# Patient Record
Sex: Female | Born: 1975 | Race: White | Hispanic: No | Marital: Single | Smoking: Former smoker
Health system: Southern US, Community
[De-identification: ages and names within clinical notes are randomized; demographics above are authoritative.]

## PROBLEM LIST (undated history)

## (undated) DIAGNOSIS — M5134 Other intervertebral disc degeneration, thoracic region: Secondary | ICD-10-CM

## (undated) DIAGNOSIS — N289 Disorder of kidney and ureter, unspecified: Secondary | ICD-10-CM

## (undated) DIAGNOSIS — K5090 Crohn's disease, unspecified, without complications: Secondary | ICD-10-CM

## (undated) DIAGNOSIS — K589 Irritable bowel syndrome without diarrhea: Secondary | ICD-10-CM

## (undated) DIAGNOSIS — M1990 Unspecified osteoarthritis, unspecified site: Secondary | ICD-10-CM

## (undated) DIAGNOSIS — M797 Fibromyalgia: Secondary | ICD-10-CM

## (undated) HISTORY — PX: RENAL ARTERY STENT: SHX2321

## (undated) HISTORY — PX: TONSILLECTOMY: SUR1361

## (undated) HISTORY — PX: ABDOMINAL HYSTERECTOMY: SHX81

---

## 2016-05-03 HISTORY — PX: FRACTURE SURGERY: SHX138

## 2019-12-15 ENCOUNTER — Other Ambulatory Visit: Payer: Self-pay | Admitting: Radiology

## 2019-12-15 ENCOUNTER — Other Ambulatory Visit: Payer: Self-pay

## 2019-12-15 ENCOUNTER — Encounter: Payer: Self-pay | Admitting: Emergency Medicine

## 2019-12-15 ENCOUNTER — Ambulatory Visit: Payer: Medicaid - Out of State

## 2019-12-15 ENCOUNTER — Emergency Department: Payer: Medicaid - Out of State

## 2019-12-15 ENCOUNTER — Ambulatory Visit: Admission: RE | Admit: 2019-12-15 | Payer: Medicaid - Out of State | Source: Ambulatory Visit

## 2019-12-15 ENCOUNTER — Emergency Department
Admission: EM | Admit: 2019-12-15 | Discharge: 2019-12-15 | Disposition: A | Discharge status: Home / Self Care | Payer: Medicaid - Out of State | Attending: Emergency Medicine | Admitting: Emergency Medicine

## 2019-12-15 DIAGNOSIS — Y999 Unspecified external cause status: Secondary | ICD-10-CM | POA: Insufficient documentation

## 2019-12-15 DIAGNOSIS — W01198A Fall on same level from slipping, tripping and stumbling with subsequent striking against other object, initial encounter: Secondary | ICD-10-CM | POA: Diagnosis not present

## 2019-12-15 DIAGNOSIS — S069X9A Unspecified intracranial injury with loss of consciousness of unspecified duration, initial encounter: Secondary | ICD-10-CM | POA: Diagnosis present

## 2019-12-15 DIAGNOSIS — Z5321 Procedure and treatment not carried out due to patient leaving prior to being seen by health care provider: Secondary | ICD-10-CM | POA: Diagnosis not present

## 2019-12-15 DIAGNOSIS — Y92002 Bathroom of unspecified non-institutional (private) residence single-family (private) house as the place of occurrence of the external cause: Secondary | ICD-10-CM | POA: Diagnosis not present

## 2019-12-15 DIAGNOSIS — Y939 Activity, unspecified: Secondary | ICD-10-CM | POA: Diagnosis not present

## 2019-12-15 HISTORY — DX: Other intervertebral disc degeneration, thoracic region: M51.34

## 2019-12-15 HISTORY — DX: Unspecified osteoarthritis, unspecified site: M19.90

## 2019-12-15 HISTORY — DX: Fibromyalgia: M79.7

## 2019-12-15 HISTORY — DX: Crohn's disease, unspecified, without complications: K50.90

## 2019-12-15 HISTORY — DX: Irritable bowel syndrome without diarrhea: K58.9

## 2019-12-15 LAB — CBC
HCT: 42.7 % (ref 36.0–46.0)
Hemoglobin: 14.6 g/dL (ref 12.0–15.0)
MCH: 32.2 pg (ref 26.0–34.0)
MCHC: 34.2 g/dL (ref 30.0–36.0)
MCV: 94.1 fL (ref 80.0–100.0)
Platelets: 524 10*3/uL — ABNORMAL HIGH (ref 150–400)
RBC: 4.54 MIL/uL (ref 3.87–5.11)
RDW: 12.2 % (ref 11.5–15.5)
WBC: 15.4 10*3/uL — ABNORMAL HIGH (ref 4.0–10.5)
nRBC: 0 % (ref 0.0–0.2)

## 2019-12-15 LAB — BASIC METABOLIC PANEL
Anion gap: 9 (ref 5–15)
BUN: 16 mg/dL (ref 6–20)
CO2: 25 mmol/L (ref 22–32)
Calcium: 9 mg/dL (ref 8.9–10.3)
Chloride: 103 mmol/L (ref 98–111)
Creatinine, Ser: 0.74 mg/dL (ref 0.44–1.00)
GFR calc Af Amer: >60 mL/min (ref >60)
GFR calc non Af Amer: >60 mL/min (ref >60)
Glucose, Bld: 109 mg/dL — ABNORMAL HIGH (ref 70–99)
Potassium: 3.9 mmol/L (ref 3.5–5.1)
Sodium: 137 mmol/L (ref 135–145)

## 2019-12-15 NOTE — ED Notes (Signed)
Pt called for repeat VS, no answer x 3.

## 2019-12-15 NOTE — ED Triage Notes (Signed)
Pt to ED via POV stating that she fell today in the bathroom and hit her head on the wall. Pt reports positive LOC, unsure how long she was out. Pt denies use of blood thinners. Pt is in NAD.

## 2019-12-15 NOTE — ED Notes (Signed)
Pt called for repeat VS, no answer x 2.

## 2019-12-15 NOTE — ED Notes (Signed)
Pt called for CT x 1.

## 2019-12-17 ENCOUNTER — Emergency Department
Admission: EM | Admit: 2019-12-17 | Discharge: 2019-12-17 | Discharge status: Against Medical Advice | Payer: Medicaid - Out of State

## 2019-12-17 ENCOUNTER — Other Ambulatory Visit: Payer: Self-pay

## 2019-12-17 ENCOUNTER — Emergency Department
Admission: EM | Admit: 2019-12-17 | Discharge: 2019-12-18 | Disposition: A | Discharge status: Other Acute Inpatient Hospital | Payer: Medicaid - Out of State | Attending: Emergency Medicine | Admitting: Emergency Medicine

## 2019-12-17 DIAGNOSIS — Z20822 Contact with and (suspected) exposure to covid-19: Secondary | ICD-10-CM | POA: Diagnosis not present

## 2019-12-17 DIAGNOSIS — M797 Fibromyalgia: Secondary | ICD-10-CM | POA: Diagnosis not present

## 2019-12-17 DIAGNOSIS — F17210 Nicotine dependence, cigarettes, uncomplicated: Secondary | ICD-10-CM | POA: Diagnosis not present

## 2019-12-17 DIAGNOSIS — F329 Major depressive disorder, single episode, unspecified: Secondary | ICD-10-CM | POA: Diagnosis not present

## 2019-12-17 DIAGNOSIS — T424X4A Poisoning by benzodiazepines, undetermined, initial encounter: Secondary | ICD-10-CM | POA: Diagnosis present

## 2019-12-17 DIAGNOSIS — T50902A Poisoning by unspecified drugs, medicaments and biological substances, intentional self-harm, initial encounter: Secondary | ICD-10-CM

## 2019-12-17 DIAGNOSIS — R519 Headache, unspecified: Secondary | ICD-10-CM | POA: Insufficient documentation

## 2019-12-17 LAB — CBC
HCT: 39.4 % (ref 36.0–46.0)
Hemoglobin: 14 g/dL (ref 12.0–15.0)
MCH: 32.6 pg (ref 26.0–34.0)
MCHC: 35.5 g/dL (ref 30.0–36.0)
MCV: 91.6 fL (ref 80.0–100.0)
Platelets: 453 10*3/uL — ABNORMAL HIGH (ref 150–400)
RBC: 4.3 MIL/uL (ref 3.87–5.11)
RDW: 12.2 % (ref 11.5–15.5)
WBC: 9.6 10*3/uL (ref 4.0–10.5)
nRBC: 0 % (ref 0.0–0.2)

## 2019-12-17 LAB — COMPREHENSIVE METABOLIC PANEL
ALT: 23 U/L (ref 0–44)
AST: 18 U/L (ref 15–41)
Albumin: 3.9 g/dL (ref 3.5–5.0)
Alkaline Phosphatase: 78 U/L (ref 38–126)
Anion gap: 12 (ref 5–15)
BUN: 12 mg/dL (ref 6–20)
CO2: 26 mmol/L (ref 22–32)
Calcium: 9.1 mg/dL (ref 8.9–10.3)
Chloride: 102 mmol/L (ref 98–111)
Creatinine, Ser: 0.78 mg/dL (ref 0.44–1.00)
GFR calc Af Amer: >60 mL/min (ref >60)
GFR calc non Af Amer: >60 mL/min (ref >60)
Glucose, Bld: 95 mg/dL (ref 70–99)
Potassium: 3.3 mmol/L — ABNORMAL LOW (ref 3.5–5.1)
Sodium: 140 mmol/L (ref 135–145)
Total Bilirubin: 1.2 mg/dL (ref 0.3–1.2)
Total Protein: 7.3 g/dL (ref 6.5–8.1)

## 2019-12-17 LAB — SALICYLATE LEVEL: Salicylate Lvl: <7 mg/dL — ABNORMAL LOW (ref 7.0–30.0)

## 2019-12-17 LAB — ACETAMINOPHEN LEVEL: Acetaminophen (Tylenol), Serum: <10 ug/mL — ABNORMAL LOW (ref 10–30)

## 2019-12-17 LAB — ETHANOL: Alcohol, Ethyl (B): <10 mg/dL (ref <10)

## 2019-12-17 MED ORDER — IBUPROFEN 600 MG PO TABS
600.0000 mg | ORAL_TABLET | Freq: Once | ORAL | Status: AC
  Administered 2019-12-17: 600 mg via ORAL
  Filled 2019-12-17: qty 1

## 2019-12-17 MED ORDER — ONDANSETRON HCL 4 MG/2ML IJ SOLN
INTRAMUSCULAR | Status: AC
  Administered 2019-12-17: 4 mg via INTRAVENOUS
  Filled 2019-12-17: qty 2

## 2019-12-17 MED ORDER — ONDANSETRON HCL 4 MG/2ML IJ SOLN: 4.0000 mg | Freq: Once | INTRAMUSCULAR | Status: AC

## 2019-12-17 NOTE — ED Notes (Signed)
Attempted to collect urine specimen, pt missed cup. Will try again with hat in toilet

## 2019-12-17 NOTE — ED Notes (Addendum)
Pt dressed out into burgundy scrubs from off white shirt, black pants, flip flops, grey underwear with Vernona Rieger, NT. Pt placed back onto heart monitor.
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
## 2019-12-17 NOTE — ED Notes (Signed)
Patient in room. No complaints, stable, in no acute distress. Q15 minute rounds and monitoring via Rover and Officer to continue.  
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
## 2019-12-17 NOTE — ED Notes (Signed)
Called   No answer in lobby  

## 2019-12-17 NOTE — ED Notes (Signed)
Called for room and triage   No answer

## 2019-12-17 NOTE — ED Notes (Addendum)
Kim Watkins (daughter) [phone]  Pt gives verbal consent to update daughter on care and status

## 2019-12-17 NOTE — ED Notes (Addendum)
Pt reports currently being homeless, visiting here from [place] and staying with family.   Pt admits to taking 10mg  hydrocodone-tylenol some time this AM, prescribed for chronic back pain

## 2019-12-17 NOTE — ED Notes (Signed)
Patient in room. No complaints, stable, in no acute distress. Q15 minute rounds and monitoring via Psychologist, counselling to continue.

## 2019-12-17 NOTE — ED Notes (Signed)
Called No answer.

## 2019-12-17 NOTE — ED Notes (Signed)
Pt dressed out into ER psych scrubs by Tyberius Ryner and morgan tech  Belongings are in pt room at this time.  lw edt
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
## 2019-12-18 ENCOUNTER — Encounter: Payer: Self-pay | Admitting: Psychiatry

## 2019-12-18 ENCOUNTER — Inpatient Hospital Stay: Payer: Medicaid - Out of State

## 2019-12-18 ENCOUNTER — Inpatient Hospital Stay
Admission: AD | Admit: 2019-12-18 | Discharge: 2019-12-19 | DRG: 882 | Disposition: A | Discharge status: Home / Self Care | Payer: Medicaid - Out of State | Source: Intra-hospital | Attending: Psychiatry | Admitting: Psychiatry

## 2019-12-18 DIAGNOSIS — Z20822 Contact with and (suspected) exposure to covid-19: Secondary | ICD-10-CM | POA: Diagnosis present

## 2019-12-18 DIAGNOSIS — S0990XA Unspecified injury of head, initial encounter: Secondary | ICD-10-CM

## 2019-12-18 DIAGNOSIS — M797 Fibromyalgia: Secondary | ICD-10-CM | POA: Diagnosis present

## 2019-12-18 DIAGNOSIS — F411 Generalized anxiety disorder: Secondary | ICD-10-CM | POA: Diagnosis present

## 2019-12-18 DIAGNOSIS — F17210 Nicotine dependence, cigarettes, uncomplicated: Secondary | ICD-10-CM | POA: Diagnosis present

## 2019-12-18 DIAGNOSIS — K219 Gastro-esophageal reflux disease without esophagitis: Secondary | ICD-10-CM | POA: Diagnosis present

## 2019-12-18 DIAGNOSIS — Z79899 Other long term (current) drug therapy: Secondary | ICD-10-CM | POA: Diagnosis not present

## 2019-12-18 DIAGNOSIS — T424X2A Poisoning by benzodiazepines, intentional self-harm, initial encounter: Secondary | ICD-10-CM | POA: Diagnosis present

## 2019-12-18 DIAGNOSIS — F32A Depression, unspecified: Secondary | ICD-10-CM | POA: Diagnosis present

## 2019-12-18 DIAGNOSIS — J45909 Unspecified asthma, uncomplicated: Secondary | ICD-10-CM | POA: Diagnosis present

## 2019-12-18 DIAGNOSIS — F329 Major depressive disorder, single episode, unspecified: Secondary | ICD-10-CM | POA: Diagnosis present

## 2019-12-18 DIAGNOSIS — T424X4A Poisoning by benzodiazepines, undetermined, initial encounter: Secondary | ICD-10-CM | POA: Diagnosis not present

## 2019-12-18 DIAGNOSIS — G894 Chronic pain syndrome: Secondary | ICD-10-CM | POA: Diagnosis present

## 2019-12-18 DIAGNOSIS — F4323 Adjustment disorder with mixed anxiety and depressed mood: Secondary | ICD-10-CM | POA: Diagnosis present

## 2019-12-18 LAB — URINE DRUG SCREEN, QUALITATIVE (ARMC ONLY)
Amphetamines, Ur Screen: NOT DETECTED
Barbiturates, Ur Screen: NOT DETECTED
Benzodiazepine, Ur Scrn: POSITIVE — AB
Cannabinoid 50 Ng, Ur ~~LOC~~: NOT DETECTED
Cocaine Metabolite,Ur ~~LOC~~: NOT DETECTED
MDMA (Ecstasy)Ur Screen: NOT DETECTED
Methadone Scn, Ur: NOT DETECTED
Opiate, Ur Screen: NOT DETECTED
Phencyclidine (PCP) Ur S: NOT DETECTED
Tricyclic, Ur Screen: NOT DETECTED

## 2019-12-18 LAB — SARS CORONAVIRUS 2 BY RT PCR (HOSPITAL ORDER, PERFORMED IN ~~LOC~~ HOSPITAL LAB): SARS Coronavirus 2: NEGATIVE

## 2019-12-18 LAB — POCT PREGNANCY, URINE: Preg Test, Ur: NEGATIVE

## 2019-12-18 MED ORDER — FLUOXETINE HCL 20 MG PO CAPS
60.0000 mg | ORAL_CAPSULE | Freq: Every day | ORAL | Status: DC
  Administered 2019-12-18 – 2019-12-19 (×2): 60 mg via ORAL
  Filled 2019-12-18 (×2): qty 3

## 2019-12-18 MED ORDER — LORAZEPAM 1 MG PO TABS
0.5000 mg | ORAL_TABLET | Freq: Three times a day (TID) | ORAL | Status: DC
  Administered 2019-12-18 – 2019-12-19 (×3): 0.5 mg via ORAL
  Filled 2019-12-18 (×3): qty 1

## 2019-12-18 MED ORDER — MELOXICAM 7.5 MG PO TABS: 15.0000 mg | ORAL_TABLET | Freq: Every day | ORAL | Status: DC

## 2019-12-18 MED ORDER — NAPROXEN 500 MG PO TABS
500.0000 mg | ORAL_TABLET | Freq: Two times a day (BID) | ORAL | Status: DC | PRN
  Administered 2019-12-18: 500 mg via ORAL
  Filled 2019-12-18: qty 1

## 2019-12-18 MED ORDER — ALBUTEROL SULFATE HFA 108 (90 BASE) MCG/ACT IN AERS
2.0000 | INHALATION_SPRAY | Freq: Four times a day (QID) | RESPIRATORY_TRACT | Status: DC | PRN
  Filled 2019-12-18: qty 6.7

## 2019-12-18 MED ORDER — PANTOPRAZOLE SODIUM 40 MG PO TBEC
40.0000 mg | DELAYED_RELEASE_TABLET | Freq: Every day | ORAL | Status: DC
  Administered 2019-12-18 – 2019-12-19 (×2): 40 mg via ORAL
  Filled 2019-12-18 (×2): qty 1

## 2019-12-18 MED ORDER — ACETAMINOPHEN 325 MG PO TABS: 650.0000 mg | ORAL_TABLET | Freq: Four times a day (QID) | ORAL | Status: DC | PRN

## 2019-12-18 MED ORDER — MAGNESIUM HYDROXIDE 400 MG/5ML PO SUSP: 30.0000 mL | Freq: Every day | ORAL | Status: DC | PRN

## 2019-12-18 MED ORDER — ALUM & MAG HYDROXIDE-SIMETH 200-200-20 MG/5ML PO SUSP: 30.0000 mL | ORAL | Status: DC | PRN

## 2019-12-18 NOTE — ED Notes (Signed)
Pt provided with additional blankets per request. Updated on plan of care. Denies further needs at this time

## 2019-12-18 NOTE — ED Notes (Signed)
Patient in room. No complaints, stable, in no acute distress. Q15 minute rounds and monitoring via Rover and Officer to continue.  

## 2019-12-18 NOTE — ED Notes (Signed)
Urine sample contaminated with toilet paper. Will attempt to collect again

## 2019-12-18 NOTE — ED Notes (Signed)
Pt provided with meal tray, plastic spoon, two cans of shasta cola per request. Denies further needs at this time 

## 2019-12-18 NOTE — Tx Team (Signed)
Initial Treatment Plan 12/18/2019 1:24 PM [name] DHR:[id number]    PATIENT STRESSORS: Legal issue Marital or family conflict   PATIENT STRENGTHS: Capable of independent living Motivation for treatment/growth   PATIENT IDENTIFIED PROBLEMS: Lack of support system  Ineffective coping skills                   DISCHARGE CRITERIA:  Improved stabilization in mood, thinking, and/or behavior Motivation to continue treatment in a less acute level of care  PRELIMINARY DISCHARGE PLAN: Outpatient therapy Return to previous living arrangement  PATIENT/FAMILY INVOLVEMENT: This treatment plan has been presented to and reviewed with the patient, Kim Watkins, and/or family member.  The patient and family have been given the opportunity to ask questions and make suggestions.  Sharin Mons, RN 12/18/2019, 1:24 PM

## 2019-12-18 NOTE — BH Assessment (Addendum)
PATIENT BED AVAILABLE 12/18/19 during day shift or 12/18/19 during night shift  Patient is to be admitted to [facility] Attending Physician will be Dr. Toni Amend.   Patient has been assigned to room 323, by [facility] Charge Nurse [place].    ER staff is aware of the admission:  [facility] ER Secretary    Dr. Don Perking, ER MD   Anette Riedel Patient's Nurse   [facility] Patient Access.
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
## 2019-12-18 NOTE — ED Notes (Signed)
Pt called out requesting something to drink - drink and snack provided  She is lying in bed with her eyes closed  - appears to be sleeping

## 2019-12-18 NOTE — ED Notes (Signed)
Pt provided with meal tray, plastic spoon, two cans of shasta cola per request. Denies further needs at this time

## 2019-12-18 NOTE — ED Notes (Signed)
Iv taken out of pt right [facility]
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
## 2019-12-19 MED ORDER — FLUOXETINE HCL 20 MG PO CAPS: 60.0000 mg | ORAL_CAPSULE | Freq: Every day | ORAL | 0 refills | Status: AC

## 2019-12-19 NOTE — Progress Notes (Signed)
:  Pt denies SI, HI and AVH. Pt was educated on discharge plan and verbalizes understanding. Pt received dc packet, belongings and a prescription.  Torrie Mayers RN

## 2019-12-19 NOTE — Progress Notes (Signed)
  [facility] Adult Case Management Discharge Plan :   Will you be returning to the same living situation after discharge:  No.  Patient reports that she lives in [place] and plans to return home, she is in [place] visiting her daughter.  At discharge, do you have transportation home?: Yes,  pt reports her daughter will provide transportation. Do you have the ability to pay for your medications: Yes,  Out of state Medicaid  Release of information consent forms completed and in the chart;  Patient's signature needed at discharge.  Patient to Follow up at:  Follow-up Information    Pt declined. Follow up.   Why: Pt declined. Contact information: Pt declined              Next level of care provider has access to [facility] Link:no  Safety Planning and Suicide Prevention discussed: No.  Pt declined SPE with collateral. SPE completed with the patient.   Have you used any form of tobacco in the last 30 days? (Cigarettes, Smokeless Tobacco, Cigars, and/or Pipes): Yes  Has patient been referred to the Quitline?: Patient refused referral  Patient has been referred for addiction treatment: Pt. refused referral  Harden Mo, LCSW 12/19/2019, 10:08 AM

## 2019-12-19 NOTE — Plan of Care (Addendum)
Pt rates depression 1/10 and anxiety 3/10. Pt denies SI, HI and AVH. Pt was educated on care plan and anticipates discharge. Torrie Mayers RN Goal: Ability to state activities that reduce stress will improve Outcome: Adequate for Discharge   Problem: Coping: Goal: Ability to identify and develop effective coping behavior will improve Outcome: Adequate for Discharge   Problem: Self-Concept: Goal: Ability to identify factors that promote anxiety will improve Outcome: Adequate for Discharge Goal: Level of anxiety will decrease Outcome: Adequate for Discharge Goal: Ability to modify response to factors that promote anxiety will improve Outcome: Adequate for Discharge   Problem: Education: Goal: Utilization of techniques to improve thought processes will improve Outcome: Adequate for Discharge Goal: Knowledge of the prescribed therapeutic regimen will improve Outcome: Adequate for Discharge   Problem: Activity: Goal: Interest or engagement in leisure activities will improve Outcome: Adequate for Discharge Goal: Imbalance in normal sleep/wake cycle will improve Outcome: Adequate for Discharge   Problem: Coping: Goal: Coping ability will improve Outcome: Adequate for Discharge Goal: Will verbalize feelings Outcome: Adequate for Discharge   Problem: Health Behavior/Discharge Planning: Goal: Ability to make decisions will improve Outcome: Adequate for Discharge Goal: Compliance with therapeutic regimen will improve Outcome: Adequate for Discharge   Problem: Role Relationship: Goal: Will demonstrate positive changes in social behaviors and relationships Outcome: Adequate for Discharge   Problem: Safety: Goal: Ability to disclose and discuss suicidal ideas will improve Outcome: Adequate for Discharge Goal: Ability to identify and utilize support systems that promote safety will improve Outcome: Adequate for Discharge   Problem: Self-Concept: Goal: Will verbalize positive  feelings about self Outcome: Adequate for Discharge Goal: Level of anxiety will decrease Outcome: Adequate for Discharge

## 2019-12-19 NOTE — Progress Notes (Signed)
Patient alert and oriented x 4, affect is flat but she brightens upon approach, she appear less irritable, she makes eye contact with Clinical research associate and she is receptive to staff. Patient was offered emotional support,  encouraged to interact with peers and attend evening wrap up group, she endorses generalized body pain and was given PRN medication with good effect. Patient is complaint with medication regimen, 15 minutes safety checks maintained will continue to monitor.

## 2019-12-19 NOTE — [facility] Counselor (Signed)
CSW discussed with the patient aftercare. Patient declined aftercare follow up in both [place] and [place].  Penni Homans, MSW, LCSW 12/19/2019 10:07 AM

## 2019-12-19 NOTE — Progress Notes (Signed)
Recreation Therapy Notes     Date: 12/19/2019  Time: 9:30 am   Location: Craft room     Behavioral response: N/A   Intervention Topic: Goals   Discussion/Intervention: Patient did not attend group.   Clinical Observations/Feedback:  Patient did not attend group.   Annagrace Carr LRT/CTRS        Benz Vandenberghe 12/19/2019 11:13 AM

## 2019-12-19 NOTE — Discharge Summary (Signed)
Physician Discharge Summary Note  Patient:  Kim Watkins is an 44 y.o., female MRN:  [id number] DOB:  12/10/1975 Patient phone:  [phone] (home)  Patient address:   [address] [place] [address],  Total Time spent with patient: 30 minutes  Date of Admission:  12/18/2019 Date of Discharge: 12/19/2019  Reason for Admission: Admitted to the hospital because of concern about excessive use of Ativan  Principal Problem: Adjustment disorder with mixed anxiety and depressed mood Discharge Diagnoses: Principal Problem:   Adjustment disorder with mixed anxiety and depressed mood Active Problems:   Depression   Acute head injury   Chronic pain syndrome   Past Psychiatric History: History of chronic anxiety and chronic pain  Past Medical History:  Past Medical History:  Diagnosis Date  . Arthritis   . Crohn's disease (HCC)   . DDD (degenerative disc disease), thoracic   . Fibromyalgia   . IBS (irritable bowel syndrome)     Past Surgical History:  Procedure Laterality Date  . ABDOMINAL HYSTERECTOMY    . FRACTURE SURGERY Right 2018   foot  . TONSILLECTOMY     Family History: History reviewed. No pertinent family history. Family Psychiatric  History: None reported Social History:  Social History   Substance and Sexual Activity  Alcohol Use Not Currently     Social History   Substance and Sexual Activity  Drug Use Not Currently    Social History   Socioeconomic History  . Marital status: Single    Spouse name: Not on file  . Number of children: Not on file  . Years of education: Not on file  . Highest education level: Not on file  Occupational History  . Not on file  Tobacco Use  . Smoking status: Current Every Day Smoker  . Smokeless tobacco: Never Used  Substance and Sexual Activity  . Alcohol use: Not Currently  . Drug use: Not Currently  . Sexual activity: Not on file  Other Topics Concern  . Not on file  Social History Narrative  . Not  on file   Social Determinants of Health   Financial Resource Strain:   . Difficulty of Paying Living Expenses:   Food Insecurity:   . Worried About Programme researcher, broadcasting/film/video in the Last Year:   . Barista in the Last Year:   Transportation Needs:   . Freight forwarder (Medical):   Marland Kitchen Lack of Transportation (Non-Medical):   Physical Activity:   . Days of Exercise per Week:   . Minutes of Exercise per Session:   Stress:   . Feeling of Stress :   Social Connections:   . Frequency of Communication with Friends and Family:   . Frequency of Social Gatherings with Friends and Family:   . Attends Religious Services:   . Active Member of Clubs or Organizations:   . Attends Banker Meetings:   Marland Kitchen Marital Status:     Hospital Course: Admitted to hospital.  15-minute checks maintained.  No dangerous aggressive violent or suicidal behavior was noted.  She was engaged in individual and group therapy and assessment.  Medicines were continued as per her outpatient treatment as best we could.  Patient completely denied suicidal ideation throughout her time in the hospital.  She stated a lucid plan for continued follow-up treatment back in [place].  Patient did not meet commitment criteria did not require further hospital treatment.  Supportive counseling and encouragement offered.  Patient was discharged with  a plan to go back to her daughter's house and then to proceed back to her home.  Physical Findings: AIMS: Facial and Oral Movements Muscles of Facial Expression: None, normal Lips and Perioral Area: None, normal Jaw: None, normal Tongue: None, normal,Extremity Movements Upper (arms, wrists, hands, fingers): None, normal Lower (legs, knees, ankles, toes): None, normal, Trunk Movements Neck, shoulders, hips: None, normal, Overall Severity Severity of abnormal movements (highest score from questions above): None, normal Incapacitation due to abnormal movements: None,  normal Patient's awareness of abnormal movements (rate only patient's report): No Awareness, Dental Status Current problems with teeth and/or dentures?: No Does patient usually wear dentures?: No  CIWA:  CIWA-Ar Total: 3 COWS:  COWS Total Score: 1  Musculoskeletal: Strength & Muscle Tone: within normal limits Gait & Station: normal Patient leans: N/A  Psychiatric Specialty Exam: Physical Exam Vitals and nursing note reviewed.  Constitutional:      Appearance: She is well-developed.  HENT:     Head: Normocephalic and atraumatic.  Eyes:     Conjunctiva/sclera: Conjunctivae normal.     Pupils: Pupils are equal, round, and reactive to light.  Cardiovascular:     Heart sounds: Normal heart sounds.  Pulmonary:     Effort: Pulmonary effort is normal.  Abdominal:     Palpations: Abdomen is soft.  Musculoskeletal:        General: Normal range of motion.     Cervical back: Normal range of motion.  Skin:    General: Skin is warm and dry.  Neurological:     General: No focal deficit present.     Mental Status: She is alert.  Psychiatric:        Attention and Perception: Attention normal.        Mood and Affect: Affect is blunt.        Speech: Speech normal.        Behavior: Behavior is cooperative.        Thought Content: Thought content normal.        Cognition and Memory: Cognition normal.        Judgment: Judgment normal.     Review of Systems  Constitutional: Negative.   HENT: Negative.   Eyes: Negative.   Respiratory: Negative.   Cardiovascular: Negative.   Gastrointestinal: Negative.   Musculoskeletal: Negative.   Skin: Negative.   Neurological: Negative.   Psychiatric/Behavioral: Negative.     Blood pressure 102/64, pulse 62, temperature 98.5 F (36.9 C), temperature source Oral, resp. rate 18, height 5\' 9"  (1.753 m), weight 104.8 kg, SpO2 99 %.Body mass index is 34.11 kg/m.  General Appearance: Casual  Eye Contact:  Good  Speech:  Clear and Coherent   Volume:  Normal  Mood:  Euthymic  Affect:  Congruent  Thought Process:  Goal Directed  Orientation:  Full (Time, Place, and Person)  Thought Content:  Logical  Suicidal Thoughts:  No  Homicidal Thoughts:  No  Memory:  Immediate;   Fair Recent;   Fair Remote;   Fair  Judgement:  Fair  Insight:  Fair  Psychomotor Activity:  Normal  Concentration:  Concentration: Fair  Recall:  of Knowledge:  Fair  Language:  Fair  Akathisia:  No  Handed:  Right  AIMS (if indicated):     Assets:  Desire for Improvement  ADL's:  Intact  Cognition:  WNL  Sleep:  Number of Hours: 8     Have you used any form of tobacco in the last 30  days? (Cigarettes, Smokeless Tobacco, Cigars, and/or Pipes): Yes  Has this patient used any form of tobacco in the last 30 days? (Cigarettes, Smokeless Tobacco, Cigars, and/or Pipes) Yes, No  Blood Alcohol level:  Lab Results  Component Value Date   ETH <10 12/17/2019    Metabolic Disorder Labs:  No results found for: HGBA1C, MPG No results found for: PROLACTIN No results found for: CHOL, TRIG, HDL, CHOLHDL, VLDL, LDLCALC  See Psychiatric Specialty Exam and Suicide Risk Assessment completed by Attending Physician prior to discharge.  Discharge destination:  Home  Is patient on multiple antipsychotic therapies at discharge:  No   Has Patient had three or more failed trials of antipsychotic monotherapy by history:  No  Recommended Plan for Multiple Antipsychotic Therapies: NA  Discharge Instructions    Diet - low sodium heart healthy   Complete by: As directed    Increase activity slowly   Complete by: As directed      Allergies as of 12/19/2019      Reactions   Doxycycline Other (See Comments)   Other reaction(s): Blisters Blisters on face   Furosemide    Other reaction(s): Blisters   Penicillins    Other reaction(s): Blisters, Unknown   Propranolol    Other reaction(s): Blisters, Other (See Comments) Blisters on face    Sulfamethoxazole-trimethoprim    Other reaction(s): Unknown Other reaction(s): Blisters   Varenicline    Other reaction(s): Blisters, Unknown   Ciprofloxacin Rash      Medication List    TAKE these medications     Indication  albuterol 108 (90 Base) MCG/ACT inhaler Commonly known as: VENTOLIN HFA Inhale 1-2 puffs into the lungs every 4 (four) hours as needed for shortness of breath or wheezing.  Indication: Asthma   FLUoxetine 20 MG capsule Commonly known as: PROZAC Take 3 capsules (60 mg total) by mouth daily. Start taking on: December 20, 2019 What changed:   medication strength  how much to take  Indication: Generalized Anxiety Disorder   gabapentin 600 MG tablet Commonly known as: NEURONTIN Take 600 mg by mouth 3 (three) times daily.  Indication: Fibromyalgia Syndrome   HYDROcodone-acetaminophen 10-325 MG tablet Commonly known as: NORCO Take 1 tablet by mouth 4 (four) times daily as needed for moderate pain or severe pain.  Indication: Pain   LORazepam 0.5 MG tablet Commonly known as: ATIVAN Take 0.5 mg by mouth 3 (three) times daily as needed for anxiety.  Indication: Feeling Anxious   meloxicam 15 MG tablet Commonly known as: MOBIC Take 15 mg by mouth daily.  Indication: Joint Damage causing Pain and Loss of Function   Nurtec 75 MG Tbdp Generic drug: Rimegepant Sulfate Take 75 mg by mouth daily as needed for migraine.  Indication: Migraine Headache   omeprazole 40 MG capsule Commonly known as: PRILOSEC Take 40 mg by mouth daily.  Indication: Gastroesophageal Reflux Disease       Follow-up Information    Pt declined. Follow up.   Why: Pt declined. Contact information: Pt declined              Follow-up recommendations:  Activity:  Activity as tolerated Diet:  Regular diet Other:  Outpatient treatment back at home as indicated  Comments: No prescriptions other than her Prozac provided at discharge  Signed: Mordecai Rasmussen, MD 12/19/2019,  6:09 PM

## 2019-12-19 NOTE — [facility] Suicide Risk Assessment (Signed)
[facility] Discharge Suicide Risk Assessment   Principal Problem: Adjustment disorder with mixed anxiety and depressed mood Discharge Diagnoses: Principal Problem:   Adjustment disorder with mixed anxiety and depressed mood Active Problems:   Depression   Acute head injury   Chronic pain syndrome   Total Time spent with patient: 30 minutes  Musculoskeletal: Strength & Muscle Tone: within normal limits Gait & Station: normal Patient leans: N/A  Psychiatric Specialty Exam: Review of Systems  Constitutional: Negative.   HENT: Negative.   Eyes: Negative.   Respiratory: Negative.   Cardiovascular: Negative.   Gastrointestinal: Negative.   Musculoskeletal: Negative.   Skin: Negative.   Neurological: Negative.   Psychiatric/Behavioral: Negative.     Blood pressure 102/64, pulse 62, temperature 98.5 F (36.9 C), temperature source Oral, resp. rate 18, height 5\' 9"  (1.753 m), weight 104.8 kg, SpO2 99 %.Body mass index is 34.11 kg/m.  General Appearance: Casual  Eye Contact::  Fair  Speech:  Slow409  Volume:  Decreased  Mood:  Euthymic  Affect:  Constricted  Thought Process:  Coherent  Orientation:  Full (Time, Place, and Person)  Thought Content:  Logical  Suicidal Thoughts:  No  Homicidal Thoughts:  No  Memory:  Immediate;   Fair Recent;   Fair Remote;   Fair  Judgement:  Fair  Insight:  Fair  Psychomotor Activity:  Normal  Concentration:  Fair  Recall:  [phone] of Knowledge:Fair  Language: Fair  Akathisia:  No  Handed:  Right  AIMS (if indicated):     Assets:  Desire for Improvement Housing Resilience  Sleep:  Number of Hours: 8  Cognition: WNL  ADL's:  Intact   Mental Status Per Nursing Assessment::   On Admission:  Self-harm thoughts, Suicidal ideation indicated by patient  Demographic Factors:  Living alone  Loss Factors: Financial problems/change in socioeconomic status  Historical Factors: Impulsivity  Risk Reduction Factors:   Sense of  responsibility to family, Employed, Positive therapeutic relationship and Positive coping skills or problem solving skills  Continued Clinical Symptoms:  Severe Anxiety and/or Agitation Medical Diagnoses and Treatments/Surgeries  Cognitive Features That Contribute To Risk:  None    Suicide Risk:  Minimal: No identifiable suicidal ideation.  Patients presenting with no risk factors but with morbid ruminations; may be classified as minimal risk based on the severity of the depressive symptoms   Follow-up Information    Pt declined. Follow up.   Why: Pt declined. Contact information: Pt declined              Plan Of Care/Follow-up recommendations:  Activity:  as tolerated Diet:  regular Other:  follow up in [phone], MD 12/19/2019, 12:00 PM

## 2020-01-22 ENCOUNTER — Emergency Department
Admission: EM | Admit: 2020-01-22 | Discharge: 2020-01-22 | Disposition: A | Discharge status: Home / Self Care | Payer: Medicaid - Out of State | Attending: Emergency Medicine | Admitting: Emergency Medicine

## 2020-01-22 ENCOUNTER — Emergency Department: Payer: Medicaid - Out of State

## 2020-01-22 ENCOUNTER — Other Ambulatory Visit: Payer: Self-pay

## 2020-01-22 ENCOUNTER — Encounter: Payer: Self-pay | Admitting: Emergency Medicine

## 2020-01-22 DIAGNOSIS — R197 Diarrhea, unspecified: Secondary | ICD-10-CM | POA: Insufficient documentation

## 2020-01-22 DIAGNOSIS — Z87891 Personal history of nicotine dependence: Secondary | ICD-10-CM | POA: Diagnosis not present

## 2020-01-22 DIAGNOSIS — J189 Pneumonia, unspecified organism: Secondary | ICD-10-CM

## 2020-01-22 DIAGNOSIS — Z79899 Other long term (current) drug therapy: Secondary | ICD-10-CM | POA: Insufficient documentation

## 2020-01-22 DIAGNOSIS — R05 Cough: Secondary | ICD-10-CM | POA: Diagnosis present

## 2020-01-22 LAB — CBC WITH DIFFERENTIAL/PLATELET
Abs Immature Granulocytes: 0.07 10*3/uL (ref 0.00–0.07)
Basophils Absolute: 0 10*3/uL (ref 0.0–0.1)
Basophils Relative: 0 %
Eosinophils Absolute: 0 10*3/uL (ref 0.0–0.5)
Eosinophils Relative: 0 %
HCT: 39.2 % (ref 36.0–46.0)
Hemoglobin: 13.5 g/dL (ref 12.0–15.0)
Immature Granulocytes: 1 %
Lymphocytes Relative: 19 %
Lymphs Abs: 1.3 10*3/uL (ref 0.7–4.0)
MCH: 31.8 pg (ref 26.0–34.0)
MCHC: 34.4 g/dL (ref 30.0–36.0)
MCV: 92.5 fL (ref 80.0–100.0)
Monocytes Absolute: 0.4 10*3/uL (ref 0.1–1.0)
Monocytes Relative: 6 %
Neutro Abs: 4.8 10*3/uL (ref 1.7–7.7)
Neutrophils Relative %: 74 %
Platelets: 296 10*3/uL (ref 150–400)
RBC: 4.24 MIL/uL (ref 3.87–5.11)
RDW: 12.3 % (ref 11.5–15.5)
WBC: 6.5 10*3/uL (ref 4.0–10.5)
nRBC: 0 % (ref 0.0–0.2)

## 2020-01-22 LAB — COMPREHENSIVE METABOLIC PANEL
ALT: 26 U/L (ref 0–44)
AST: 30 U/L (ref 15–41)
Albumin: 3.4 g/dL — ABNORMAL LOW (ref 3.5–5.0)
Alkaline Phosphatase: 64 U/L (ref 38–126)
Anion gap: 10 (ref 5–15)
BUN: 10 mg/dL (ref 6–20)
CO2: 25 mmol/L (ref 22–32)
Calcium: 8.4 mg/dL — ABNORMAL LOW (ref 8.9–10.3)
Chloride: 103 mmol/L (ref 98–111)
Creatinine, Ser: 0.66 mg/dL (ref 0.44–1.00)
GFR calc Af Amer: >60 mL/min (ref >60)
GFR calc non Af Amer: >60 mL/min (ref >60)
Glucose, Bld: 104 mg/dL — ABNORMAL HIGH (ref 70–99)
Potassium: 3.5 mmol/L (ref 3.5–5.1)
Sodium: 138 mmol/L (ref 135–145)
Total Bilirubin: 0.6 mg/dL (ref 0.3–1.2)
Total Protein: 7.1 g/dL (ref 6.5–8.1)

## 2020-01-22 LAB — URINALYSIS, COMPLETE (UACMP) WITH MICROSCOPIC
Bilirubin Urine: NEGATIVE
Glucose, UA: NEGATIVE mg/dL
Hgb urine dipstick: NEGATIVE
Ketones, ur: 5 mg/dL — AB
Nitrite: NEGATIVE
Protein, ur: 30 mg/dL — AB
Specific Gravity, Urine: 1.024 (ref 1.005–1.030)
pH: 6 (ref 5.0–8.0)

## 2020-01-22 LAB — LACTIC ACID, PLASMA: Lactic Acid, Venous: 0.9 mmol/L (ref 0.5–1.9)

## 2020-01-22 MED ORDER — PREDNISONE 50 MG PO TABS: 50.0000 mg | ORAL_TABLET | Freq: Every day | ORAL | 0 refills | Status: AC

## 2020-01-22 MED ORDER — ONDANSETRON 4 MG PO TBDP: 4.0000 mg | ORAL_TABLET | Freq: Once | ORAL | Status: AC | PRN

## 2020-01-22 MED ORDER — ONDANSETRON 4 MG PO TBDP
ORAL_TABLET | ORAL | Status: AC
  Administered 2020-01-22: 4 mg via ORAL
  Filled 2020-01-22: qty 1

## 2020-01-22 MED ORDER — AZITHROMYCIN 250 MG PO TABS: ORAL_TABLET | ORAL | 0 refills | Status: AC

## 2020-01-22 MED ORDER — PSEUDOEPH-BROMPHEN-DM 30-2-10 MG/5ML PO SYRP: 10.0000 mL | ORAL_SOLUTION | Freq: Four times a day (QID) | ORAL | 0 refills | Status: AC | PRN

## 2020-01-22 MED ORDER — BENZONATATE 100 MG PO CAPS: 100.0000 mg | ORAL_CAPSULE | Freq: Four times a day (QID) | ORAL | 0 refills | Status: AC | PRN

## 2020-01-22 NOTE — ED Notes (Signed)
See triage note  Presents with body aches low grade temp and decreased appetite  States she has been tested 3-4 times which has been negative  Her sxs' started 11-12 days ago

## 2020-01-22 NOTE — ED Triage Notes (Signed)
Patient to ER for c/o shortness of breath, fatigue, diarrhea, fever, nausea, body aches since approx 01/12/20. Patient's daughter recently tested positive for Covid, but is now out of quarantine. Patient reports taking 4 Covid tests recently that were all negative.

## 2020-01-22 NOTE — ED Provider Notes (Signed)
-----------------------------------------   5:11 PM on 01/22/2020 -----------------------------------------  EKG viewed and interpreted by myself shows a normal sinus rhythm 83 bpm with a narrow QRS, normal axis, normal intervals, no concerning ST changes.   Minna Antis, MD 01/22/20 1711

## 2020-01-22 NOTE — ED Provider Notes (Signed)
[facility] Emergency Department Provider Note  ____________________________________________  Time seen: Approximately 4:57 PM  I have reviewed the triage vital signs and the nursing notes.   HISTORY  Chief Complaint Shortness of Breath    HPI Kim Watkins is a 44 y.o. female who presents the emergency department complaining of cough, shortness of breath, malaise, fatigue, body aches, diarrhea.  Patient states that she has had symptoms x2 weeks.  Patient states that multiple family members have had COVID-19 and she has been exposed to same.  She states that she has been tested 4 times in the past 2 weeks for Covid with negative results each time.  Patient has been trying over-the-counter medications for symptom relief but states that at this point she felt like she needed to present for evaluation as over-the-counter medications were no longer effective.  Patient states that shortness of breath is exertional.  No chest pain.  No abdominal pain.  Patient has a history of Crohn's disease, degenerative disc disease, fibromyalgia and IBS.  No complaints of chronic medical issues.         Past Medical History:  Diagnosis Date  . Arthritis   . Crohn's disease (HCC)   . DDD (degenerative disc disease), thoracic   . Fibromyalgia   . IBS (irritable bowel syndrome)     Patient Active Problem List   Diagnosis Date Noted  . Depression 12/18/2019  . Adjustment disorder with mixed anxiety and depressed mood 12/18/2019  . Acute head injury 12/18/2019  . Chronic pain syndrome 12/18/2019    Past Surgical History:  Procedure Laterality Date  . ABDOMINAL HYSTERECTOMY    . FRACTURE SURGERY Right 2018   foot  . TONSILLECTOMY      Prior to Admission medications   Medication Sig Start Date End Date Taking? Authorizing Provider  albuterol (VENTOLIN HFA) 108 (90 Base) MCG/ACT inhaler Inhale 1-2 puffs into the lungs every 4 (four) hours as needed for shortness of  breath or wheezing. 08/21/19   [provider]  azithromycin (ZITHROMAX Z-PAK) 250 MG tablet Take 2 tablets (500 mg) on  Day 1,  followed by 1 tablet (250 mg) once daily on Days 2 through 5. 01/22/20   Reagann Dolce, Delorise Royals, PA-C  benzonatate (TESSALON PERLES) 100 MG capsule Take 1 capsule (100 mg total) by mouth every 6 (six) hours as needed. 01/22/20 01/21/21  Lamiracle Chaidez, Delorise Royals, PA-C  brompheniramine-pseudoephedrine-DM 30-2-10 MG/5ML syrup Take 10 mLs by mouth 4 (four) times daily as needed. 01/22/20   Phila Shoaf, Delorise Royals, PA-C  FLUoxetine (PROZAC) 20 MG capsule Take 3 capsules (60 mg total) by mouth daily. 12/20/19   Clapacs, Jackquline Denmark, MD  gabapentin (NEURONTIN) 600 MG tablet Take 600 mg by mouth 3 (three) times daily. 09/17/19   [provider]  HYDROcodone-acetaminophen (NORCO) 10-325 MG tablet Take 1 tablet by mouth 4 (four) times daily as needed for moderate pain or severe pain. 12/17/19   [provider]  LORazepam (ATIVAN) 0.5 MG tablet Take 0.5 mg by mouth 3 (three) times daily as needed for anxiety. 12/17/19   [provider]  meloxicam (MOBIC) 15 MG tablet Take 15 mg by mouth daily. 09/17/19   [provider]  NURTEC 75 MG TBDP Take 75 mg by mouth daily as needed for migraine. 12/04/19   [provider]  omeprazole (PRILOSEC) 40 MG capsule Take 40 mg by mouth daily. 11/10/19   [provider]  predniSONE (DELTASONE) 50 MG tablet Take 1 tablet (50 mg  total) by mouth daily with breakfast. 01/22/20   Chala Gul, Delorise Royals, PA-C    Allergies Doxycycline, Furosemide, Penicillins, Propranolol, Sulfamethoxazole-trimethoprim, Varenicline, and Ciprofloxacin  No family history on file.  Social History Social History   Tobacco Use  . Smoking status: Former Smoker    Types: Cigarettes    Quit date: 01/15/2020    Years since quitting: 0.0  . Smokeless tobacco: Never Used  Substance Use Topics  . Alcohol use: Not Currently  . Drug use:  Not Currently     Review of Systems  Constitutional: No fever/chills.  Positive for body aches, generalized malaise Eyes: No visual changes. No discharge ENT: No upper respiratory complaints. Cardiovascular: no chest pain. Respiratory: Positive cough.  Exertional SOB. Gastrointestinal: No abdominal pain.  No nausea, no vomiting.  No diarrhea.  No constipation. Genitourinary: Negative for dysuria. No hematuria Musculoskeletal: Negative for musculoskeletal pain. Skin: Negative for rash, abrasions, lacerations, ecchymosis. Neurological: Negative for headaches, focal weakness or numbness. 10-point ROS otherwise negative.  ____________________________________________   PHYSICAL EXAM:  VITAL SIGNS: ED Triage Vitals [01/22/20 1508]  Enc Vitals Group     BP (!) 91/50     Pulse Rate 85     Resp 20     Temp 99 F (37.2 C)     Temp Source Oral     SpO2 95 %     Weight 250 lb (113.4 kg)     Height 5\' 9"  (1.753 m)     Head Circumference      Peak Flow      Pain Score 10     Pain Loc      Pain Edu?      Excl. in GC?      Constitutional: Alert and oriented. Well appearing and in no acute distress. Eyes: Conjunctivae are normal. PERRL. EOMI. Head: Atraumatic. ENT:      Ears:       Nose: No congestion/rhinnorhea.      Mouth/Throat: Mucous membranes are moist.  Neck: No stridor.  Neck is supple [place] motion Hematological/Lymphatic/Immunilogical: No cervical lymphadenopathy. Cardiovascular: Normal rate, regular rhythm. Normal S1 and S2.  Good peripheral circulation. Respiratory: Normal respiratory effort without tachypnea or retractions. Lungs mild expiratory wheezing and mild crackles to the right middle lobe.  No other significant adventitious lung sounds. air entry to the bases with no decreased or absent breath sounds. Musculoskeletal: Full range of motion to all extremities. No gross deformities appreciated. Neurologic:  Normal speech and language. No gross focal  neurologic deficits are appreciated.  Skin:  Skin is warm, dry and intact. No rash noted. Psychiatric: Mood and affect are normal. Speech and behavior are normal. Patient exhibits appropriate insight and judgement.   ____________________________________________   LABS (all labs ordered are listed, but only abnormal results are displayed)  Labs Reviewed  COMPREHENSIVE METABOLIC PANEL - Abnormal; Notable for the following components:      Result Value   Glucose, Bld 104 (*)    Calcium 8.4 (*)    Albumin 3.4 (*)    All other components within normal limits  URINALYSIS, COMPLETE (UACMP) WITH MICROSCOPIC - Abnormal; Notable for the following components:   Color, Urine AMBER (*)    APPearance CLOUDY (*)    Ketones, ur 5 (*)    Protein, ur 30 (*)    Leukocytes,Ua SMALL (*)    Bacteria, UA RARE (*)    All other components within normal limits  LACTIC ACID, PLASMA  CBC WITH DIFFERENTIAL/PLATELET  LACTIC  ACID, PLASMA   ____________________________________________  EKG   ____________________________________________  RADIOLOGY I personally viewed and evaluated these images as part of my medical decision making, as well as reviewing the written report by the radiologist.  DG Chest 2 View  Result Date: 01/22/2020 CLINICAL DATA:  Shortness of breath, fatigue, diarrhea, COVID-19 exposure EXAM: CHEST - 2 VIEW COMPARISON:  None. FINDINGS: Frontal and lateral views of the chest demonstrate an unremarkable cardiac silhouette. There is focal airspace disease within the lateral segment right middle lobe abutting the minor fissure. No other areas of consolidation, effusion, or pneumothorax. No acute bony abnormalities. IMPRESSION: 1. Right middle lobe pneumonia. Electronically Signed   By: Sharlet Salina M.D.   On: 01/22/2020 15:48    ____________________________________________    PROCEDURES  Procedure(s) performed:    Procedures    Medications  ondansetron (ZOFRAN-ODT)  disintegrating tablet 4 mg (4 mg Oral Given 01/22/20 1544)     ____________________________________________   INITIAL IMPRESSION / ASSESSMENT AND PLAN / ED COURSE  Pertinent labs & imaging results that were available during my care of the patient were reviewed by me and considered in my medical decision making (see chart for details).  Review of the [place] CSRS was performed in accordance of the [facility] prior to dispensing any controlled drugs.           Patient's diagnosis is consistent with right middle lobe pneumonia.  Patient presented to the emergency department complaining of cough, fever, shortness of breath, malaise, body aches, diarrhea x2 weeks.  Multiple COVID-19 contacts.  Patient has had 4 negative tests over the past 2 weeks for COVID-19.  Patient had findings consistent with right middle lobe pneumonia on the right side.  I feel that this is likely Covid pneumonia, but as she has had multiple negative test, which were PCR test, I will treat the patient as if she has community-acquired pneumonia with antibiotics.  I will prescribe azithromycin, prednisone, Bromfed cough syrup, Tessalon Perles..  At this time we will not test the patient as she is slightly over 2 weeks of symptoms, for negative test and this would not change the outcome for the patient.  Follow-up primary care as needed.  Patient is given ED precautions to return to the ED for any worsening or new symptoms.     ____________________________________________  FINAL CLINICAL IMPRESSION(S) / ED DIAGNOSES  Final diagnoses:  Community acquired pneumonia of right middle lobe of lung      NEW MEDICATIONS STARTED DURING THIS VISIT:  ED Discharge Orders         Ordered    azithromycin (ZITHROMAX Z-PAK) 250 MG tablet        01/22/20 1722    predniSONE (DELTASONE) 50 MG tablet  Daily with breakfast        01/22/20 1722    brompheniramine-pseudoephedrine-DM 30-2-10 MG/5ML syrup  4 times daily PRN        01/22/20 1722     benzonatate (TESSALON PERLES) 100 MG capsule  Every 6 hours PRN        01/22/20 1722              This chart was dictated using voice recognition software/Dragon. Despite best efforts to proofread, errors can occur which can change the meaning. Any change was purely unintentional.    Racheal Patches, PA-C 01/22/20 1722    Sharyn Creamer, MD 01/22/20 2330

## 2020-02-18 ENCOUNTER — Emergency Department: Payer: Medicaid - Out of State

## 2020-02-18 ENCOUNTER — Emergency Department
Admission: EM | Admit: 2020-02-18 | Discharge: 2020-02-18 | Disposition: A | Discharge status: Home / Self Care | Payer: Medicaid - Out of State | Attending: Emergency Medicine | Admitting: Emergency Medicine

## 2020-02-18 ENCOUNTER — Other Ambulatory Visit: Payer: Self-pay

## 2020-02-18 ENCOUNTER — Encounter: Payer: Self-pay | Admitting: Emergency Medicine

## 2020-02-18 DIAGNOSIS — Z79899 Other long term (current) drug therapy: Secondary | ICD-10-CM | POA: Insufficient documentation

## 2020-02-18 DIAGNOSIS — Z87891 Personal history of nicotine dependence: Secondary | ICD-10-CM | POA: Insufficient documentation

## 2020-02-18 DIAGNOSIS — R1031 Right lower quadrant pain: Secondary | ICD-10-CM | POA: Diagnosis present

## 2020-02-18 DIAGNOSIS — N200 Calculus of kidney: Secondary | ICD-10-CM | POA: Diagnosis not present

## 2020-02-18 HISTORY — DX: Disorder of kidney and ureter, unspecified: N28.9

## 2020-02-18 LAB — URINALYSIS, COMPLETE (UACMP) WITH MICROSCOPIC
Bacteria, UA: NONE SEEN
Bilirubin Urine: NEGATIVE
Glucose, UA: NEGATIVE mg/dL
Ketones, ur: 5 mg/dL — AB
Leukocytes,Ua: NEGATIVE
Nitrite: NEGATIVE
Protein, ur: NEGATIVE mg/dL
Specific Gravity, Urine: 1.028 (ref 1.005–1.030)
Squamous Epithelial / HPF: NONE SEEN (ref 0–5)
WBC, UA: NONE SEEN WBC/hpf (ref 0–5)
pH: 5 (ref 5.0–8.0)

## 2020-02-18 LAB — COMPREHENSIVE METABOLIC PANEL
ALT: 57 U/L — ABNORMAL HIGH (ref 0–44)
AST: 46 U/L — ABNORMAL HIGH (ref 15–41)
Albumin: 3.6 g/dL (ref 3.5–5.0)
Alkaline Phosphatase: 68 U/L (ref 38–126)
Anion gap: 10 (ref 5–15)
BUN: 13 mg/dL (ref 6–20)
CO2: 20 mmol/L — ABNORMAL LOW (ref 22–32)
Calcium: 8.4 mg/dL — ABNORMAL LOW (ref 8.9–10.3)
Chloride: 110 mmol/L (ref 98–111)
Creatinine, Ser: 0.69 mg/dL (ref 0.44–1.00)
GFR, Estimated: >60 mL/min (ref >60)
Glucose, Bld: 111 mg/dL — ABNORMAL HIGH (ref 70–99)
Potassium: 3 mmol/L — ABNORMAL LOW (ref 3.5–5.1)
Sodium: 140 mmol/L (ref 135–145)
Total Bilirubin: 0.7 mg/dL (ref 0.3–1.2)
Total Protein: 6.6 g/dL (ref 6.5–8.1)

## 2020-02-18 LAB — CBC WITH DIFFERENTIAL/PLATELET
Abs Immature Granulocytes: 0.14 10*3/uL — ABNORMAL HIGH (ref 0.00–0.07)
Basophils Absolute: 0.1 10*3/uL (ref 0.0–0.1)
Basophils Relative: 1 %
Eosinophils Absolute: 0.4 10*3/uL (ref 0.0–0.5)
Eosinophils Relative: 3 %
HCT: 35.1 % — ABNORMAL LOW (ref 36.0–46.0)
Hemoglobin: 12.3 g/dL (ref 12.0–15.0)
Immature Granulocytes: 1 %
Lymphocytes Relative: 25 %
Lymphs Abs: 2.7 10*3/uL (ref 0.7–4.0)
MCH: 31.6 pg (ref 26.0–34.0)
MCHC: 35 g/dL (ref 30.0–36.0)
MCV: 90.2 fL (ref 80.0–100.0)
Monocytes Absolute: 1.3 10*3/uL — ABNORMAL HIGH (ref 0.1–1.0)
Monocytes Relative: 13 %
Neutro Abs: 6.1 10*3/uL (ref 1.7–7.7)
Neutrophils Relative %: 57 %
Platelets: 486 10*3/uL — ABNORMAL HIGH (ref 150–400)
RBC: 3.89 MIL/uL (ref 3.87–5.11)
RDW: 13.1 % (ref 11.5–15.5)
WBC: 10.6 10*3/uL — ABNORMAL HIGH (ref 4.0–10.5)
nRBC: 0 % (ref 0.0–0.2)

## 2020-02-18 MED ORDER — FENTANYL CITRATE (PF) 100 MCG/2ML IJ SOLN
INTRAMUSCULAR | Status: AC
  Administered 2020-02-18: 50 ug via INTRAVENOUS
  Filled 2020-02-18: qty 2

## 2020-02-18 MED ORDER — SODIUM CHLORIDE 0.9 % IV BOLUS
1000.0000 mL | Freq: Once | INTRAVENOUS | Status: AC
  Administered 2020-02-18: 1000 mL via INTRAVENOUS

## 2020-02-18 MED ORDER — OXYCODONE-ACETAMINOPHEN 5-325 MG PO TABS: 1.0000 | ORAL_TABLET | ORAL | 0 refills | Status: AC | PRN

## 2020-02-18 MED ORDER — FENTANYL CITRATE (PF) 100 MCG/2ML IJ SOLN: 50.0000 ug | Freq: Once | INTRAMUSCULAR | Status: AC

## 2020-02-18 MED ORDER — HYDROMORPHONE HCL 1 MG/ML IJ SOLN
1.0000 mg | Freq: Once | INTRAMUSCULAR | Status: AC
  Administered 2020-02-18: 1 mg via INTRAVENOUS
  Filled 2020-02-18: qty 1

## 2020-02-18 MED ORDER — ONDANSETRON HCL 4 MG/2ML IJ SOLN
INTRAMUSCULAR | Status: AC
  Administered 2020-02-18: 4 mg via INTRAVENOUS
  Filled 2020-02-18: qty 2

## 2020-02-18 MED ORDER — TAMSULOSIN HCL 0.4 MG PO CAPS: 0.4000 mg | ORAL_CAPSULE | Freq: Every day | ORAL | 0 refills | Status: AC

## 2020-02-18 MED ORDER — FENTANYL CITRATE (PF) 100 MCG/2ML IJ SOLN
50.0000 ug | Freq: Once | INTRAMUSCULAR | Status: AC
  Administered 2020-02-18: 50 ug via INTRAVENOUS
  Filled 2020-02-18: qty 2

## 2020-02-18 MED ORDER — ONDANSETRON HCL 4 MG/2ML IJ SOLN: 4.0000 mg | Freq: Once | INTRAMUSCULAR | Status: AC

## 2020-02-18 MED ORDER — KETOROLAC TROMETHAMINE 30 MG/ML IJ SOLN
30.0000 mg | Freq: Once | INTRAMUSCULAR | Status: AC
  Administered 2020-02-18: 30 mg via INTRAVENOUS

## 2020-02-18 NOTE — ED Triage Notes (Signed)
C/O right flank pain.  STates has history of kidney stones.  Pain started this morning at 0700

## 2020-02-18 NOTE — ED Provider Notes (Signed)
[facility] Emergency Department Provider Note  Time seen: 9:40 AM  I have reviewed the triage vital signs and the nursing notes.   HISTORY  Chief Complaint Flank Pain   HPI Kim Watkins is a 44 y.o. female with a past medical history of Crohn's disease, fibromyalgia, multiple kidney stones in the past, presents to the emergency department for right flank pain.  According to the patient she was awoken from her sleep around 7 AM this morning with severe sharp right flank pain.  States it starts in her back and wraps around to her right lower quadrant.  States it feels identical to prior kidney stones.  Patient states she has had 30+ kidney stones in the past.  1 of which required a stent the others have passed on their own.  Patient denies any dysuria, did note specks of blood within her urine this morning.  Denies any fever.  States she has been nauseated with multiple episodes of vomiting this morning due to the pain.   Past Medical History:  Diagnosis Date  . Arthritis   . Crohn's disease (HCC)   . DDD (degenerative disc disease), thoracic   . Fibromyalgia   . IBS (irritable bowel syndrome)   . Renal disorder    kidney stones    Patient Active Problem List   Diagnosis Date Noted  . Depression 12/18/2019  . Adjustment disorder with mixed anxiety and depressed mood 12/18/2019  . Acute head injury 12/18/2019  . Chronic pain syndrome 12/18/2019    Past Surgical History:  Procedure Laterality Date  . ABDOMINAL HYSTERECTOMY    . FRACTURE SURGERY Right 2018   foot  . RENAL ARTERY STENT    . TONSILLECTOMY      Prior to Admission medications   Medication Sig Start Date End Date Taking? Authorizing Provider  albuterol (VENTOLIN HFA) 108 (90 Base) MCG/ACT inhaler Inhale 1-2 puffs into the lungs every 4 (four) hours as needed for shortness of breath or wheezing. 08/21/19   [provider]  azithromycin (ZITHROMAX Z-PAK) 250 MG tablet Take 2 tablets  (500 mg) on  Day 1,  followed by 1 tablet (250 mg) once daily on Days 2 through 5. 01/22/20   Cuthriell, Delorise Royals, PA-C  benzonatate (TESSALON PERLES) 100 MG capsule Take 1 capsule (100 mg total) by mouth every 6 (six) hours as needed. 01/22/20 01/21/21  Cuthriell, Delorise Royals, PA-C  brompheniramine-pseudoephedrine-DM 30-2-10 MG/5ML syrup Take 10 mLs by mouth 4 (four) times daily as needed. 01/22/20   Cuthriell, Delorise Royals, PA-C  FLUoxetine (PROZAC) 20 MG capsule Take 3 capsules (60 mg total) by mouth daily. 12/20/19   Clapacs, Jackquline Denmark, MD  gabapentin (NEURONTIN) 600 MG tablet Take 600 mg by mouth 3 (three) times daily. 09/17/19   [provider]  HYDROcodone-acetaminophen (NORCO) 10-325 MG tablet Take 1 tablet by mouth 4 (four) times daily as needed for moderate pain or severe pain. 12/17/19   [provider]  LORazepam (ATIVAN) 0.5 MG tablet Take 0.5 mg by mouth 3 (three) times daily as needed for anxiety. 12/17/19   [provider]  meloxicam (MOBIC) 15 MG tablet Take 15 mg by mouth daily. 09/17/19   [provider]  NURTEC 75 MG TBDP Take 75 mg by mouth daily as needed for migraine. 12/04/19   [provider]  omeprazole (PRILOSEC) 40 MG capsule Take 40 mg by mouth daily. 11/10/19   [provider]  predniSONE (DELTASONE) 50 MG tablet Take 1 tablet (  50 mg total) by mouth daily with breakfast. 01/22/20   Cuthriell, Delorise Royals, PA-C    Allergies  Allergen Reactions  . Doxycycline Other (See Comments)    Other reaction(s): Blisters Blisters on face   . Furosemide     Other reaction(s): Blisters  . Penicillins     Other reaction(s): Blisters, Unknown  . Propranolol     Other reaction(s): Blisters, Other (See Comments) Blisters on face   . Sulfamethoxazole-Trimethoprim     Other reaction(s): Unknown Other reaction(s): Blisters  . Varenicline     Other reaction(s): Blisters, Unknown  . Ciprofloxacin Rash    No family history on file.  Social  History Social History   Tobacco Use  . Smoking status: Former Smoker    Types: Cigarettes    Quit date: 01/15/2020    Years since quitting: 0.0  . Smokeless tobacco: Never Used  Substance Use Topics  . Alcohol use: Not Currently  . Drug use: Not Currently    Review of Systems Constitutional: Negative for fever. Cardiovascular: Negative for chest pain. Respiratory: Negative for shortness of breath. Gastrointestinal: Right flank pain.  Nausea vomiting. Genitourinary: Hematuria this morning. Musculoskeletal: Negative for musculoskeletal complaints Neurological: Negative for headache All other ROS negative  ____________________________________________   PHYSICAL EXAM:  VITAL SIGNS: ED Triage Vitals  Enc Vitals Group     BP 02/18/20 0853 (!) 145/81     Pulse Rate 02/18/20 0853 71     Resp 02/18/20 0855 (!) 24     Temp 02/18/20 0853 97.8 F (36.6 C)     Temp Source 02/18/20 0853 Oral     SpO2 02/18/20 0853 97 %     Weight 02/18/20 0838 250 lb (113.4 kg)     Height 02/18/20 0838 5\' 9"  (1.753 m)     Head Circumference --      Peak Flow --      Pain Score 02/18/20 0838 10     Pain Loc --      Pain Edu? --      Excl. in GC? --     Constitutional: Alert and oriented.  Moderate distress due to pain, rocking back and forth in the chair holding her right flank. Eyes: Normal exam ENT      Head: Normocephalic and atraumatic.      Mouth/Throat: Mucous membranes are moist. Cardiovascular: Normal rate, regular rhythm around 100 bpm.  No obvious murmur. Respiratory: Normal respiratory effort without tachypnea nor retractions. Breath sounds are clear and equal bilaterally. Gastrointestinal: Soft and nontender. No distention.   Musculoskeletal: Nontender with normal range of motion in all extremities Neurologic:  Normal speech and language. No gross focal neurologic deficits Skin:  Skin is warm, dry and intact.  Psychiatric: Mood and affect are  normal  ____________________________________________    RADIOLOGY  CT shows an obstructing 3 x 4 x 6 calculus at the right UVJ.  2 mm stone in the left kidney.  ____________________________________________   INITIAL IMPRESSION / ASSESSMENT AND PLAN / ED COURSE  Pertinent labs & imaging results that were available during my care of the patient were reviewed by me and considered in my medical decision making (see chart for details).   Patient presents to the emergency department with severe right flank pain starting at 7:00 this morning.  CT shows a 3 x 4 x 6 obstructing stone at the right UVJ likely the cause of the patient's significant discomfort.  No better after 50 mcg of fentanyl given in triage.  We will dose additional 50 mcg of fentanyl as well as 30 mg of Toradol and reassess.  Patient receiving IV fluids.  Patient has no significant tenderness on examination.  Lab work is pending at this time.  Patient is urinalysis is negative.  Work-up is overall reassuring.  We will discharge with urology referral.  Patient agreeable plan of care.  Discussed my typical kidney stone return precautions.  Kim Watkins was evaluated in Emergency Department on 02/18/2020 for the symptoms described in the history of present illness. She was evaluated in the context of the global COVID-19 pandemic, which necessitated consideration that the patient might be at risk for infection with the SARS-CoV-2 virus that causes COVID-19. Institutional protocols and algorithms that pertain to the evaluation of patients at risk for COVID-19 are in a state of rapid change based on information released by regulatory bodies including the CDC and federal and state organizations. These policies and algorithms were followed during the patient's care in the ED.  ____________________________________________   FINAL CLINICAL IMPRESSION(S) / ED DIAGNOSES  Kidney stone Right flank pain   Minna Antis, MD 02/18/20  1206

## 2020-02-19 LAB — URINE CULTURE: Culture: <10000 — AB

## 2021-08-10 IMAGING — CT CT HEAD W/O CM
3 series · 15 of 47 positions shown, 18 images · non-contrast
Comparison: No pertinent prior exams are available for comparison.

CLINICAL DATA: Head trauma, abnormal mental status. Additional
provided: Patient reports right-sided head pain after hitting right
side of head a few days ago.

EXAM:
CT HEAD WITHOUT CONTRAST
TECHNIQUE: Contiguous axial images were obtained from the base of the skull
through the vertex without intravenous contrast.

[Series 2: head wo · axial · 0.47mm/px · z∈[-137,-12]mm · 9 of 31 slices shown, 12 images]
[im 3/31  brain]
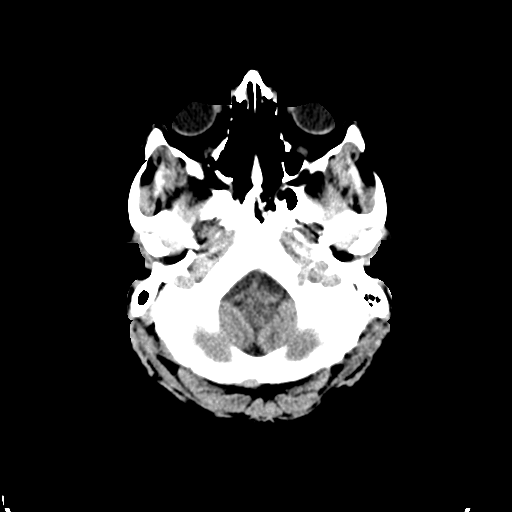
[im 3/31  bone]
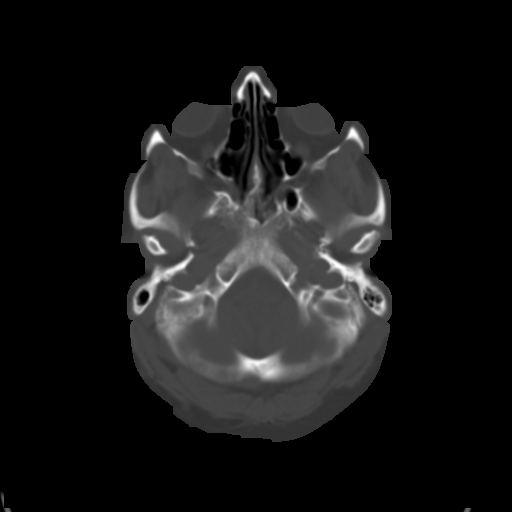
[im 6/31  brain]
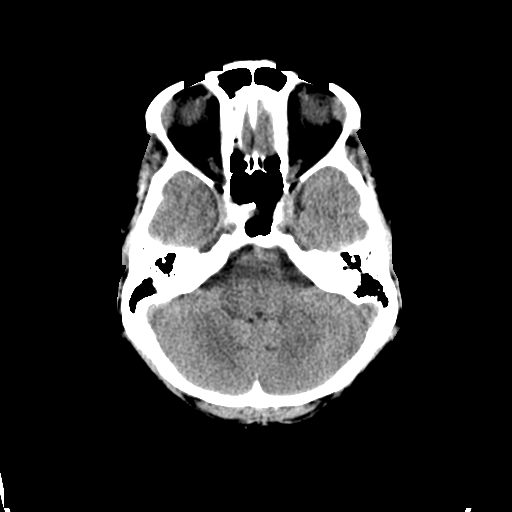
[im 9/31  brain]
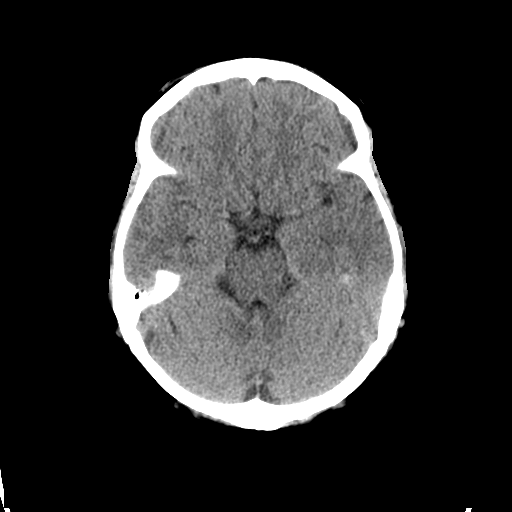
[im 12/31  brain]
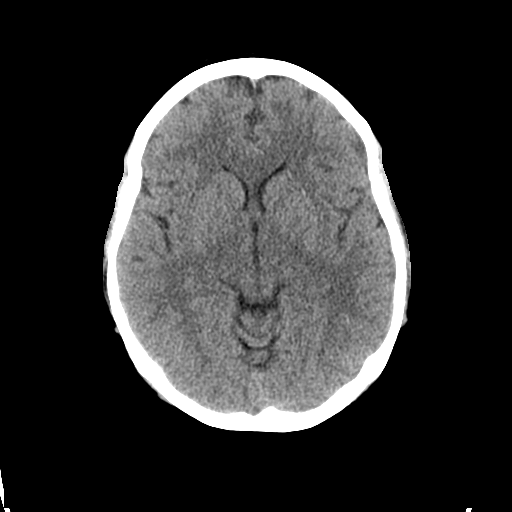
[im 16/31  brain]
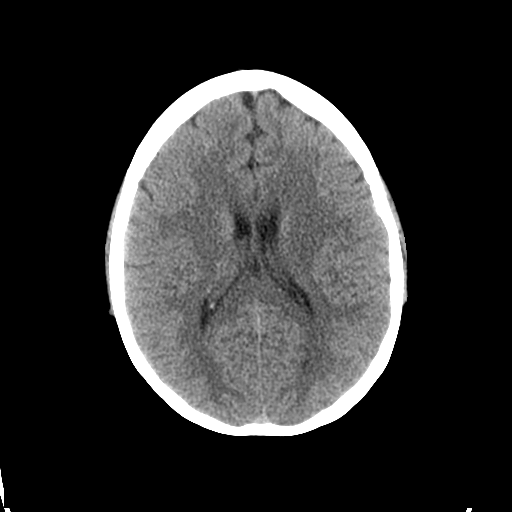
[im 16/31  bone]
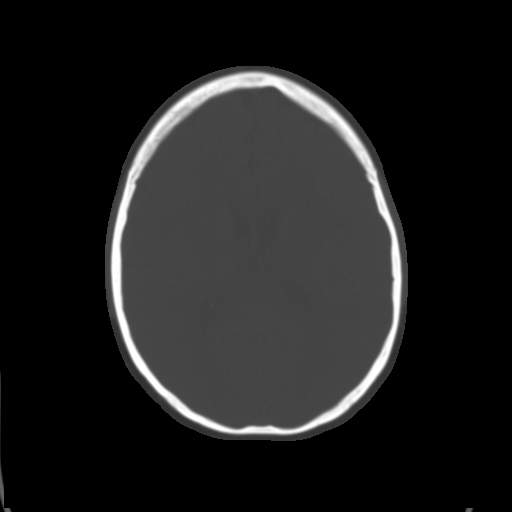
[im 19/31  brain]
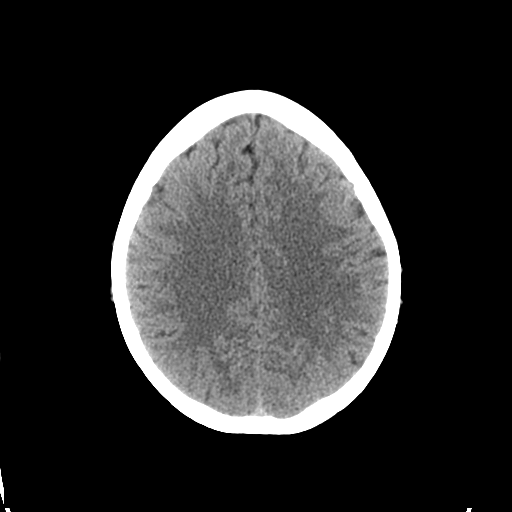
[im 22/31  brain]
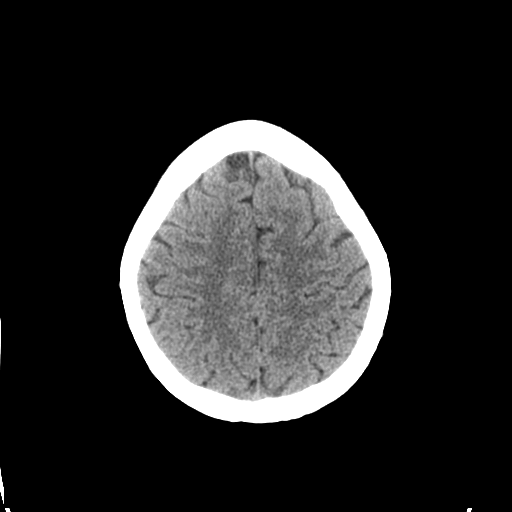
[im 25/31  brain]
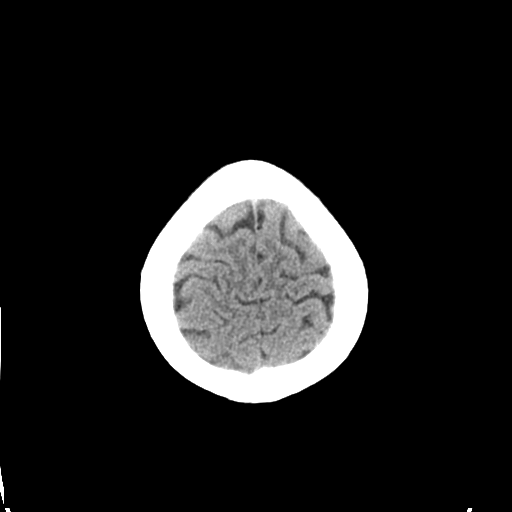
[im 28/31  brain]
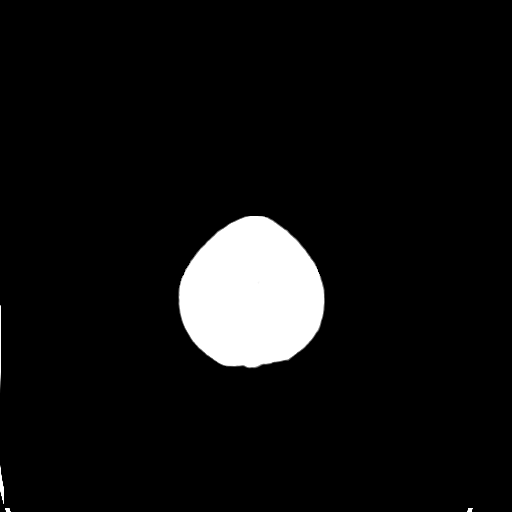
[im 28/31  bone]
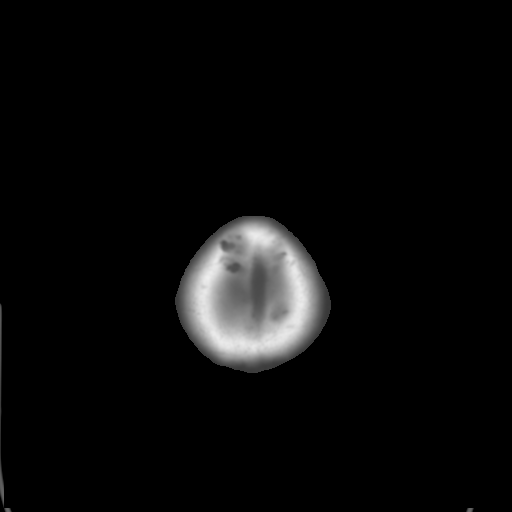

[Series 4: coronal soft tissue · coronal · 0.30mm/px · 3 of 60 slices shown]
[im 20/60  brain]
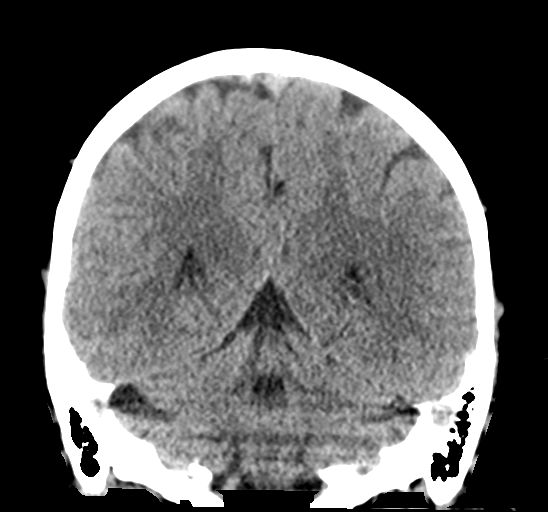
[im 27/60  brain]
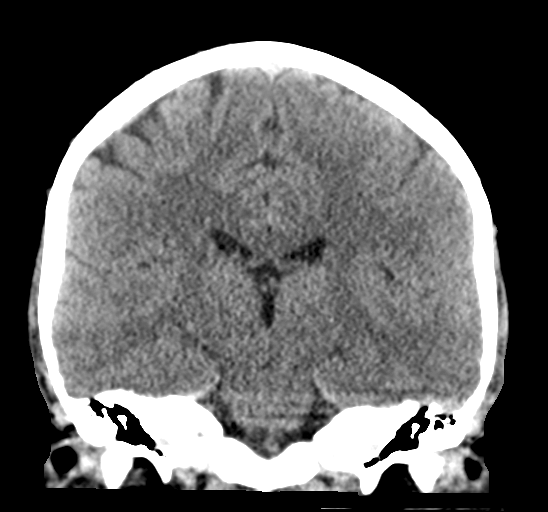
[im 33/60  brain]
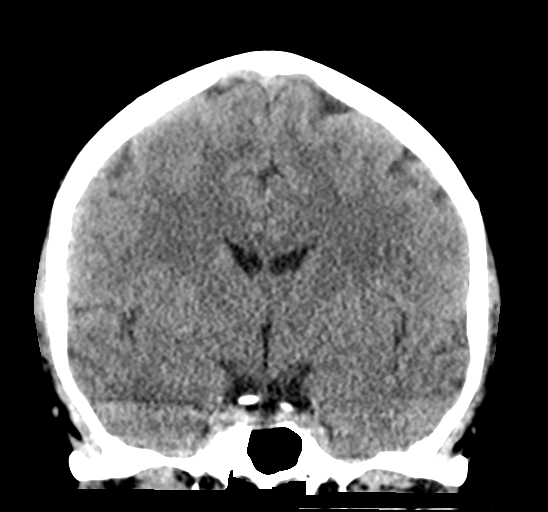

[Series 5: sagittal soft tissue · sagittal · 0.30mm/px · 3 of 51 slices shown]
[im 17/51  brain]
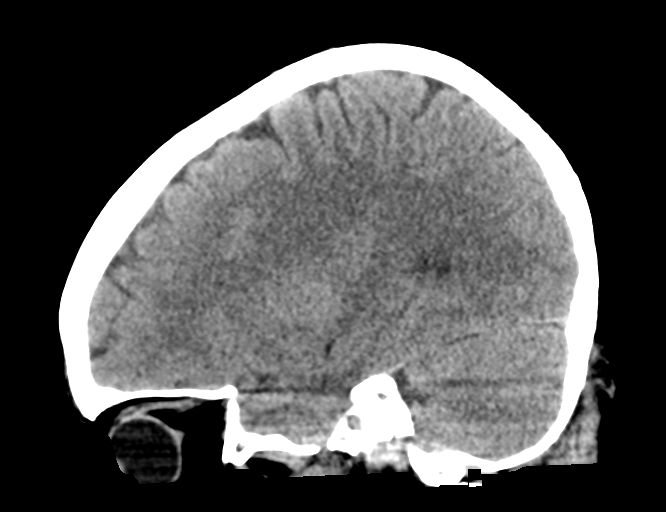
[im 26/51  brain]
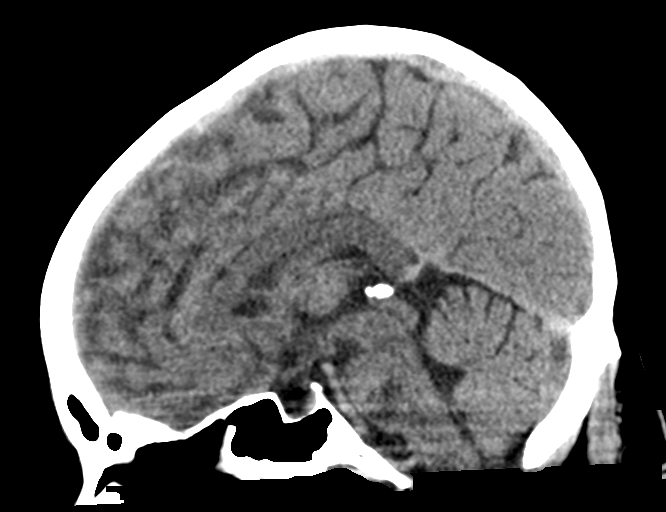
[im 34/51  brain]
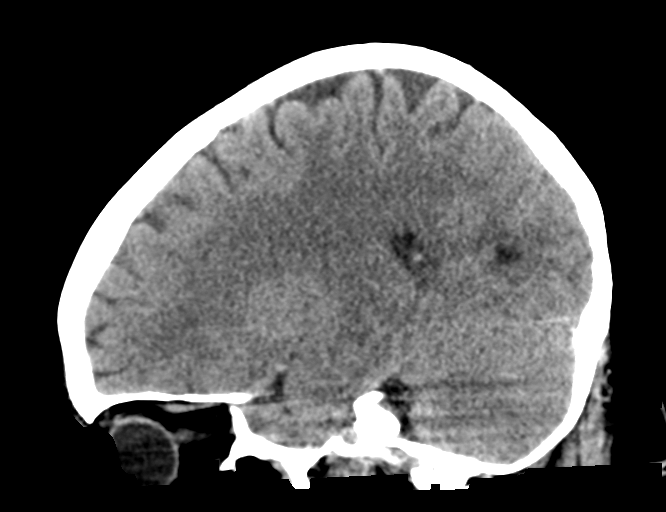

[15 of 47 positions shown; findings below may reference images not displayed]

FINDINGS: Brain:

Cerebral volume is normal.

There is no acute intracranial hemorrhage.

No demarcated cortical infarct.

No extra-axial fluid collection.

No evidence of intracranial mass.

No midline shift.

Vascular: No hyperdense vessel.

Skull: Normal. Negative for fracture or focal lesion.

Sinuses/Orbits: Visualized orbits show no acute finding. Minimal
ethmoid sinus mucosal thickening. No significant mastoid effusion.
IMPRESSION: No evidence of acute intracranial abnormality.

Minimal ethmoid sinus mucosal thickening.
